# Patient Record
Sex: Female | Born: 1975 | Race: Black or African American | Hispanic: No | Marital: Single | Smoking: Never smoker
Health system: Southern US, Community
[De-identification: ages and names within clinical notes are randomized; demographics above are authoritative.]

## PROBLEM LIST (undated history)

## (undated) DIAGNOSIS — F419 Anxiety disorder, unspecified: Secondary | ICD-10-CM

---

## 2010-11-26 ENCOUNTER — Encounter: Payer: Self-pay | Admitting: *Deleted

## 2010-11-26 ENCOUNTER — Emergency Department (HOSPITAL_BASED_OUTPATIENT_CLINIC_OR_DEPARTMENT_OTHER)
Admission: EM | Admit: 2010-11-26 | Discharge: 2010-11-26 | Disposition: A | Discharge status: Home / Self Care | Payer: Medicaid Other | Attending: Emergency Medicine | Admitting: Emergency Medicine

## 2010-11-26 DIAGNOSIS — B373 Candidiasis of vulva and vagina: Secondary | ICD-10-CM | POA: Insufficient documentation

## 2010-11-26 DIAGNOSIS — B3731 Acute candidiasis of vulva and vagina: Secondary | ICD-10-CM | POA: Insufficient documentation

## 2010-11-26 DIAGNOSIS — N898 Other specified noninflammatory disorders of vagina: Secondary | ICD-10-CM | POA: Insufficient documentation

## 2010-11-26 HISTORY — DX: Anxiety disorder, unspecified: F41.9

## 2010-11-26 LAB — URINE MICROSCOPIC-ADD ON

## 2010-11-26 LAB — URINALYSIS, ROUTINE W REFLEX MICROSCOPIC
Glucose, UA: NEGATIVE mg/dL
pH: 6 (ref 5.0–8.0)

## 2010-11-26 LAB — PREGNANCY, URINE: Preg Test, Ur: NEGATIVE

## 2010-11-26 MED ORDER — FLUCONAZOLE 50 MG PO TABS
150.0000 mg | ORAL_TABLET | Freq: Once | ORAL | Status: AC
  Administered 2010-11-26: 150 mg via ORAL
  Filled 2010-11-26: qty 1

## 2010-11-26 NOTE — ED Notes (Signed)
Pt states she has vaginal itching since her last period- using miconazole without relief- unsure of vaginal d/c- also has a hemorrhoid she wants to have checked

## 2010-11-26 NOTE — ED Provider Notes (Signed)
History     CSN: [id number] Arrival date & time: 11/26/2010  5:13 PM  Chief Complaint  Patient presents with  . Vaginal Itching   Patient is a 35 y.o. female presenting with vaginal itching. The history is provided by the patient.  Vaginal Itching This is a new problem. The current episode started in the past 7 days. The problem occurs constantly. The problem has been unchanged. Pertinent negatives include no fever, nausea or urinary symptoms. The symptoms are aggravated by nothing. Treatments tried: otc yeast treatment. The treatment provided mild relief.  Pt complains of vaginal itching.  Pt reports she was diagnosed with a yeast infection and has been using otc medications.    Past Medical History  Diagnosis Date  . Anxiety     History reviewed. No pertinent past surgical history.  No family history on file.  History  Substance Use Topics  . Smoking status: Never Smoker   . Smokeless tobacco: Not on file  . Alcohol Use: No    OB History    Grav Para Term Preterm Abortions TAB SAB Ect Mult Living                  Review of Systems  Constitutional: Negative for fever.  Gastrointestinal: Negative for nausea.  Genitourinary: Positive for vaginal discharge.  All other systems reviewed and are negative.    Physical Exam  BP 120/86  Pulse 73  Temp(Src) 97.8 F (36.6 C) (Oral)  Resp 18  Ht 5\' 2"  (1.575 m)  Wt 175 lb (79.379 kg)  BMI 32.01 kg/m2  SpO2 100%  LMP 11/12/2010  Physical Exam  Nursing note and vitals reviewed. Constitutional: She appears well-developed and well-nourished.  HENT:  Head: Normocephalic and atraumatic.  Eyes: Pupils are equal, round, and reactive to light.  Neck: Normal range of motion.  Cardiovascular: Normal rate.   Pulmonary/Chest: Effort normal.  Abdominal: Soft. Bowel sounds are normal.  Genitourinary: Uterus normal. Vaginal discharge found.  Musculoskeletal: Normal range of motion.  Neurological: She is alert.  Skin: Skin is  warm.    ED Course  Procedures  MDM Pt given diflucan here.  Pt advised to follow with her MD for recheck.      Langston Masker, [place] 11/26/10 1906

## 2010-11-27 NOTE — ED Provider Notes (Signed)
Evaluation and management procedures were performed by the PA/NP under my supervision/collaboration.   Dione Booze, MD 11/27/10 [phone]

## 2014-12-07 ENCOUNTER — Encounter: Payer: Medicaid Other | Admitting: Obstetrics & Gynecology

## 2015-01-04 ENCOUNTER — Encounter: Payer: Medicaid Other | Admitting: Obstetrics and Gynecology

## 2015-01-05 ENCOUNTER — Encounter: Payer: Self-pay | Admitting: Obstetrics & Gynecology

## 2015-01-05 ENCOUNTER — Ambulatory Visit (INDEPENDENT_AMBULATORY_CARE_PROVIDER_SITE_OTHER): Payer: Medicaid Other | Admitting: Obstetrics & Gynecology

## 2015-01-05 NOTE — Progress Notes (Signed)
   CLINIC ENCOUNTER NOTE Patient was referred for contraception counseling but declined this during check-in process.  Visit cancelled as per her request.   Jaynie Collins, MD, FACOG Attending Obstetrician & Gynecologist, [facility] for [facility]

## 2018-10-19 ENCOUNTER — Encounter (HOSPITAL_BASED_OUTPATIENT_CLINIC_OR_DEPARTMENT_OTHER): Payer: Self-pay

## 2018-10-19 DIAGNOSIS — G479 Sleep disorder, unspecified: Secondary | ICD-10-CM

## 2018-11-03 ENCOUNTER — Encounter (HOSPITAL_BASED_OUTPATIENT_CLINIC_OR_DEPARTMENT_OTHER): Payer: Self-pay

## 2019-10-14 DIAGNOSIS — Z419 Encounter for procedure for purposes other than remedying health state, unspecified: Secondary | ICD-10-CM | POA: Diagnosis not present

## 2019-11-14 DIAGNOSIS — Z419 Encounter for procedure for purposes other than remedying health state, unspecified: Secondary | ICD-10-CM | POA: Diagnosis not present

## 2019-12-15 DIAGNOSIS — N182 Chronic kidney disease, stage 2 (mild): Secondary | ICD-10-CM | POA: Diagnosis not present

## 2019-12-15 DIAGNOSIS — D229 Melanocytic nevi, unspecified: Secondary | ICD-10-CM | POA: Diagnosis not present

## 2019-12-15 DIAGNOSIS — E6609 Other obesity due to excess calories: Secondary | ICD-10-CM | POA: Diagnosis not present

## 2019-12-15 DIAGNOSIS — E785 Hyperlipidemia, unspecified: Secondary | ICD-10-CM | POA: Diagnosis not present

## 2019-12-15 DIAGNOSIS — N912 Amenorrhea, unspecified: Secondary | ICD-10-CM | POA: Diagnosis not present

## 2019-12-15 DIAGNOSIS — I10 Essential (primary) hypertension: Secondary | ICD-10-CM | POA: Diagnosis not present

## 2019-12-15 DIAGNOSIS — F419 Anxiety disorder, unspecified: Secondary | ICD-10-CM | POA: Diagnosis not present

## 2019-12-15 DIAGNOSIS — Z124 Encounter for screening for malignant neoplasm of cervix: Secondary | ICD-10-CM | POA: Diagnosis not present

## 2019-12-15 DIAGNOSIS — Z309 Encounter for contraceptive management, unspecified: Secondary | ICD-10-CM | POA: Diagnosis not present

## 2019-12-15 DIAGNOSIS — K219 Gastro-esophageal reflux disease without esophagitis: Secondary | ICD-10-CM | POA: Diagnosis not present

## 2019-12-15 DIAGNOSIS — Z419 Encounter for procedure for purposes other than remedying health state, unspecified: Secondary | ICD-10-CM | POA: Diagnosis not present

## 2020-01-05 DIAGNOSIS — Z7689 Persons encountering health services in other specified circumstances: Secondary | ICD-10-CM | POA: Diagnosis not present

## 2020-01-05 DIAGNOSIS — N182 Chronic kidney disease, stage 2 (mild): Secondary | ICD-10-CM | POA: Diagnosis not present

## 2020-01-05 DIAGNOSIS — K219 Gastro-esophageal reflux disease without esophagitis: Secondary | ICD-10-CM | POA: Diagnosis not present

## 2020-01-05 DIAGNOSIS — Z309 Encounter for contraceptive management, unspecified: Secondary | ICD-10-CM | POA: Diagnosis not present

## 2020-01-05 DIAGNOSIS — F419 Anxiety disorder, unspecified: Secondary | ICD-10-CM | POA: Diagnosis not present

## 2020-01-05 DIAGNOSIS — I10 Essential (primary) hypertension: Secondary | ICD-10-CM | POA: Diagnosis not present

## 2020-01-05 DIAGNOSIS — N912 Amenorrhea, unspecified: Secondary | ICD-10-CM | POA: Diagnosis not present

## 2020-01-05 DIAGNOSIS — E6609 Other obesity due to excess calories: Secondary | ICD-10-CM | POA: Diagnosis not present

## 2020-01-05 DIAGNOSIS — Z8619 Personal history of other infectious and parasitic diseases: Secondary | ICD-10-CM | POA: Diagnosis not present

## 2020-01-05 DIAGNOSIS — E785 Hyperlipidemia, unspecified: Secondary | ICD-10-CM | POA: Diagnosis not present

## 2020-01-14 DIAGNOSIS — Z419 Encounter for procedure for purposes other than remedying health state, unspecified: Secondary | ICD-10-CM | POA: Diagnosis not present

## 2020-02-14 DIAGNOSIS — Z419 Encounter for procedure for purposes other than remedying health state, unspecified: Secondary | ICD-10-CM | POA: Diagnosis not present

## 2020-03-15 DIAGNOSIS — Z23 Encounter for immunization: Secondary | ICD-10-CM | POA: Diagnosis not present

## 2020-03-15 DIAGNOSIS — K219 Gastro-esophageal reflux disease without esophagitis: Secondary | ICD-10-CM | POA: Diagnosis not present

## 2020-03-15 DIAGNOSIS — Z419 Encounter for procedure for purposes other than remedying health state, unspecified: Secondary | ICD-10-CM | POA: Diagnosis not present

## 2020-03-15 DIAGNOSIS — E785 Hyperlipidemia, unspecified: Secondary | ICD-10-CM | POA: Diagnosis not present

## 2020-03-15 DIAGNOSIS — N182 Chronic kidney disease, stage 2 (mild): Secondary | ICD-10-CM | POA: Diagnosis not present

## 2020-03-15 DIAGNOSIS — R110 Nausea: Secondary | ICD-10-CM | POA: Diagnosis not present

## 2020-03-15 DIAGNOSIS — I10 Essential (primary) hypertension: Secondary | ICD-10-CM | POA: Diagnosis not present

## 2020-03-15 DIAGNOSIS — Z309 Encounter for contraceptive management, unspecified: Secondary | ICD-10-CM | POA: Diagnosis not present

## 2020-03-15 DIAGNOSIS — E6609 Other obesity due to excess calories: Secondary | ICD-10-CM | POA: Diagnosis not present

## 2020-03-15 DIAGNOSIS — Z7689 Persons encountering health services in other specified circumstances: Secondary | ICD-10-CM | POA: Diagnosis not present

## 2020-03-15 DIAGNOSIS — F419 Anxiety disorder, unspecified: Secondary | ICD-10-CM | POA: Diagnosis not present

## 2020-04-15 DIAGNOSIS — Z419 Encounter for procedure for purposes other than remedying health state, unspecified: Secondary | ICD-10-CM | POA: Diagnosis not present

## 2020-04-26 DIAGNOSIS — K219 Gastro-esophageal reflux disease without esophagitis: Secondary | ICD-10-CM | POA: Diagnosis not present

## 2020-04-26 DIAGNOSIS — R110 Nausea: Secondary | ICD-10-CM | POA: Diagnosis not present

## 2020-04-26 DIAGNOSIS — E6609 Other obesity due to excess calories: Secondary | ICD-10-CM | POA: Diagnosis not present

## 2020-04-26 DIAGNOSIS — F419 Anxiety disorder, unspecified: Secondary | ICD-10-CM | POA: Diagnosis not present

## 2020-04-26 DIAGNOSIS — E785 Hyperlipidemia, unspecified: Secondary | ICD-10-CM | POA: Diagnosis not present

## 2020-04-26 DIAGNOSIS — I10 Essential (primary) hypertension: Secondary | ICD-10-CM | POA: Diagnosis not present

## 2020-04-26 DIAGNOSIS — N182 Chronic kidney disease, stage 2 (mild): Secondary | ICD-10-CM | POA: Diagnosis not present

## 2020-04-26 DIAGNOSIS — Z7689 Persons encountering health services in other specified circumstances: Secondary | ICD-10-CM | POA: Diagnosis not present

## 2020-04-26 DIAGNOSIS — Z309 Encounter for contraceptive management, unspecified: Secondary | ICD-10-CM | POA: Diagnosis not present

## 2020-05-16 DIAGNOSIS — Z419 Encounter for procedure for purposes other than remedying health state, unspecified: Secondary | ICD-10-CM | POA: Diagnosis not present

## 2020-06-13 DIAGNOSIS — Z419 Encounter for procedure for purposes other than remedying health state, unspecified: Secondary | ICD-10-CM | POA: Diagnosis not present

## 2020-07-14 DIAGNOSIS — Z419 Encounter for procedure for purposes other than remedying health state, unspecified: Secondary | ICD-10-CM | POA: Diagnosis not present

## 2020-07-19 ENCOUNTER — Other Ambulatory Visit: Payer: Self-pay

## 2020-07-19 ENCOUNTER — Ambulatory Visit (HOSPITAL_COMMUNITY): Payer: Medicaid Other | Admitting: Licensed Clinical Social Worker

## 2020-07-19 DIAGNOSIS — Z008 Encounter for other general examination: Secondary | ICD-10-CM

## 2020-07-19 NOTE — Progress Notes (Signed)
Pt came into session today for initial assessment. She states that she left so inappropriate message on her social workers Engineer, technical sales. LCSW asked if she wanted therapy and medication mgmt. Pt stated not at this time. LCSW asked pt if she was her own guardian and pt stated yes. She was alert and oriented x 5. She states that her social worker wanted her to come in with an evaluation, but Phyllis King did not feel it was needed. LCSW explained right to self-determination and her patient rights. LCSW left assessment open in the future if pt felt it was needed.

## 2020-08-02 DIAGNOSIS — I10 Essential (primary) hypertension: Secondary | ICD-10-CM | POA: Diagnosis not present

## 2020-08-02 DIAGNOSIS — N182 Chronic kidney disease, stage 2 (mild): Secondary | ICD-10-CM | POA: Diagnosis not present

## 2020-08-02 DIAGNOSIS — F419 Anxiety disorder, unspecified: Secondary | ICD-10-CM | POA: Diagnosis not present

## 2020-08-02 DIAGNOSIS — K219 Gastro-esophageal reflux disease without esophagitis: Secondary | ICD-10-CM | POA: Diagnosis not present

## 2020-08-02 DIAGNOSIS — Z309 Encounter for contraceptive management, unspecified: Secondary | ICD-10-CM | POA: Diagnosis not present

## 2020-08-02 DIAGNOSIS — E785 Hyperlipidemia, unspecified: Secondary | ICD-10-CM | POA: Diagnosis not present

## 2020-08-02 DIAGNOSIS — E6609 Other obesity due to excess calories: Secondary | ICD-10-CM | POA: Diagnosis not present

## 2020-08-02 DIAGNOSIS — E039 Hypothyroidism, unspecified: Secondary | ICD-10-CM | POA: Diagnosis not present

## 2020-08-13 DIAGNOSIS — Z419 Encounter for procedure for purposes other than remedying health state, unspecified: Secondary | ICD-10-CM | POA: Diagnosis not present

## 2020-09-12 DIAGNOSIS — E785 Hyperlipidemia, unspecified: Secondary | ICD-10-CM | POA: Diagnosis not present

## 2020-09-12 DIAGNOSIS — Z7689 Persons encountering health services in other specified circumstances: Secondary | ICD-10-CM | POA: Diagnosis not present

## 2020-09-12 DIAGNOSIS — E039 Hypothyroidism, unspecified: Secondary | ICD-10-CM | POA: Diagnosis not present

## 2020-09-12 DIAGNOSIS — N182 Chronic kidney disease, stage 2 (mild): Secondary | ICD-10-CM | POA: Diagnosis not present

## 2020-09-12 DIAGNOSIS — I10 Essential (primary) hypertension: Secondary | ICD-10-CM | POA: Diagnosis not present

## 2020-09-12 DIAGNOSIS — Z309 Encounter for contraceptive management, unspecified: Secondary | ICD-10-CM | POA: Diagnosis not present

## 2020-09-12 DIAGNOSIS — K219 Gastro-esophageal reflux disease without esophagitis: Secondary | ICD-10-CM | POA: Diagnosis not present

## 2020-09-12 DIAGNOSIS — E6609 Other obesity due to excess calories: Secondary | ICD-10-CM | POA: Diagnosis not present

## 2020-09-12 DIAGNOSIS — F419 Anxiety disorder, unspecified: Secondary | ICD-10-CM | POA: Diagnosis not present

## 2020-09-12 DIAGNOSIS — R110 Nausea: Secondary | ICD-10-CM | POA: Diagnosis not present

## 2020-09-13 DIAGNOSIS — Z419 Encounter for procedure for purposes other than remedying health state, unspecified: Secondary | ICD-10-CM | POA: Diagnosis not present

## 2020-10-13 DIAGNOSIS — Z419 Encounter for procedure for purposes other than remedying health state, unspecified: Secondary | ICD-10-CM | POA: Diagnosis not present

## 2020-11-13 DIAGNOSIS — Z419 Encounter for procedure for purposes other than remedying health state, unspecified: Secondary | ICD-10-CM | POA: Diagnosis not present

## 2020-11-22 DIAGNOSIS — N182 Chronic kidney disease, stage 2 (mild): Secondary | ICD-10-CM | POA: Diagnosis not present

## 2020-11-22 DIAGNOSIS — I10 Essential (primary) hypertension: Secondary | ICD-10-CM | POA: Diagnosis not present

## 2020-11-22 DIAGNOSIS — Z0000 Encounter for general adult medical examination without abnormal findings: Secondary | ICD-10-CM | POA: Diagnosis not present

## 2020-11-22 DIAGNOSIS — F419 Anxiety disorder, unspecified: Secondary | ICD-10-CM | POA: Diagnosis not present

## 2020-11-22 DIAGNOSIS — Z309 Encounter for contraceptive management, unspecified: Secondary | ICD-10-CM | POA: Diagnosis not present

## 2020-11-22 DIAGNOSIS — E785 Hyperlipidemia, unspecified: Secondary | ICD-10-CM | POA: Diagnosis not present

## 2020-11-22 DIAGNOSIS — E6609 Other obesity due to excess calories: Secondary | ICD-10-CM | POA: Diagnosis not present

## 2020-11-22 DIAGNOSIS — E039 Hypothyroidism, unspecified: Secondary | ICD-10-CM | POA: Diagnosis not present

## 2020-11-22 DIAGNOSIS — K219 Gastro-esophageal reflux disease without esophagitis: Secondary | ICD-10-CM | POA: Diagnosis not present

## 2020-11-30 ENCOUNTER — Other Ambulatory Visit: Payer: Self-pay | Admitting: Physician Assistant

## 2020-11-30 DIAGNOSIS — Z1231 Encounter for screening mammogram for malignant neoplasm of breast: Secondary | ICD-10-CM

## 2020-12-04 ENCOUNTER — Other Ambulatory Visit: Payer: Self-pay

## 2020-12-04 ENCOUNTER — Ambulatory Visit
Admission: RE | Admit: 2020-12-04 | Discharge: 2020-12-04 | Disposition: A | Discharge status: Home / Self Care | Payer: Medicaid Other | Source: Ambulatory Visit | Attending: Physician Assistant | Admitting: Physician Assistant

## 2020-12-04 DIAGNOSIS — Z1231 Encounter for screening mammogram for malignant neoplasm of breast: Secondary | ICD-10-CM | POA: Diagnosis not present

## 2020-12-14 DIAGNOSIS — Z419 Encounter for procedure for purposes other than remedying health state, unspecified: Secondary | ICD-10-CM | POA: Diagnosis not present

## 2021-01-13 DIAGNOSIS — Z419 Encounter for procedure for purposes other than remedying health state, unspecified: Secondary | ICD-10-CM | POA: Diagnosis not present

## 2021-02-13 DIAGNOSIS — Z419 Encounter for procedure for purposes other than remedying health state, unspecified: Secondary | ICD-10-CM | POA: Diagnosis not present

## 2021-02-22 DIAGNOSIS — F419 Anxiety disorder, unspecified: Secondary | ICD-10-CM | POA: Diagnosis not present

## 2021-02-22 DIAGNOSIS — E039 Hypothyroidism, unspecified: Secondary | ICD-10-CM | POA: Diagnosis not present

## 2021-02-22 DIAGNOSIS — E6609 Other obesity due to excess calories: Secondary | ICD-10-CM | POA: Diagnosis not present

## 2021-02-22 DIAGNOSIS — I10 Essential (primary) hypertension: Secondary | ICD-10-CM | POA: Diagnosis not present

## 2021-02-22 DIAGNOSIS — K219 Gastro-esophageal reflux disease without esophagitis: Secondary | ICD-10-CM | POA: Diagnosis not present

## 2021-02-22 DIAGNOSIS — Z309 Encounter for contraceptive management, unspecified: Secondary | ICD-10-CM | POA: Diagnosis not present

## 2021-02-22 DIAGNOSIS — E785 Hyperlipidemia, unspecified: Secondary | ICD-10-CM | POA: Diagnosis not present

## 2021-02-22 DIAGNOSIS — N182 Chronic kidney disease, stage 2 (mild): Secondary | ICD-10-CM | POA: Diagnosis not present

## 2021-03-15 DIAGNOSIS — Z419 Encounter for procedure for purposes other than remedying health state, unspecified: Secondary | ICD-10-CM | POA: Diagnosis not present

## 2021-04-15 DIAGNOSIS — Z419 Encounter for procedure for purposes other than remedying health state, unspecified: Secondary | ICD-10-CM | POA: Diagnosis not present

## 2021-05-16 DIAGNOSIS — Z419 Encounter for procedure for purposes other than remedying health state, unspecified: Secondary | ICD-10-CM | POA: Diagnosis not present

## 2021-06-13 DIAGNOSIS — Z419 Encounter for procedure for purposes other than remedying health state, unspecified: Secondary | ICD-10-CM | POA: Diagnosis not present

## 2021-07-03 DIAGNOSIS — E039 Hypothyroidism, unspecified: Secondary | ICD-10-CM | POA: Diagnosis not present

## 2021-07-03 DIAGNOSIS — E785 Hyperlipidemia, unspecified: Secondary | ICD-10-CM | POA: Diagnosis not present

## 2021-07-03 DIAGNOSIS — K219 Gastro-esophageal reflux disease without esophagitis: Secondary | ICD-10-CM | POA: Diagnosis not present

## 2021-07-03 DIAGNOSIS — E6609 Other obesity due to excess calories: Secondary | ICD-10-CM | POA: Diagnosis not present

## 2021-07-03 DIAGNOSIS — F419 Anxiety disorder, unspecified: Secondary | ICD-10-CM | POA: Diagnosis not present

## 2021-07-03 DIAGNOSIS — Z131 Encounter for screening for diabetes mellitus: Secondary | ICD-10-CM | POA: Diagnosis not present

## 2021-07-03 DIAGNOSIS — N182 Chronic kidney disease, stage 2 (mild): Secondary | ICD-10-CM | POA: Diagnosis not present

## 2021-07-03 DIAGNOSIS — Z309 Encounter for contraceptive management, unspecified: Secondary | ICD-10-CM | POA: Diagnosis not present

## 2021-07-03 DIAGNOSIS — I10 Essential (primary) hypertension: Secondary | ICD-10-CM | POA: Diagnosis not present

## 2021-07-14 DIAGNOSIS — Z419 Encounter for procedure for purposes other than remedying health state, unspecified: Secondary | ICD-10-CM | POA: Diagnosis not present

## 2021-08-13 DIAGNOSIS — Z419 Encounter for procedure for purposes other than remedying health state, unspecified: Secondary | ICD-10-CM | POA: Diagnosis not present

## 2021-09-13 DIAGNOSIS — Z419 Encounter for procedure for purposes other than remedying health state, unspecified: Secondary | ICD-10-CM | POA: Diagnosis not present

## 2021-10-13 DIAGNOSIS — Z419 Encounter for procedure for purposes other than remedying health state, unspecified: Secondary | ICD-10-CM | POA: Diagnosis not present

## 2021-11-06 ENCOUNTER — Other Ambulatory Visit: Payer: Self-pay | Admitting: Physician Assistant

## 2021-11-06 DIAGNOSIS — Z0000 Encounter for general adult medical examination without abnormal findings: Secondary | ICD-10-CM | POA: Diagnosis not present

## 2021-11-06 DIAGNOSIS — F419 Anxiety disorder, unspecified: Secondary | ICD-10-CM | POA: Diagnosis not present

## 2021-11-06 DIAGNOSIS — K219 Gastro-esophageal reflux disease without esophagitis: Secondary | ICD-10-CM | POA: Diagnosis not present

## 2021-11-06 DIAGNOSIS — N182 Chronic kidney disease, stage 2 (mild): Secondary | ICD-10-CM | POA: Diagnosis not present

## 2021-11-06 DIAGNOSIS — I10 Essential (primary) hypertension: Secondary | ICD-10-CM | POA: Diagnosis not present

## 2021-11-06 DIAGNOSIS — E6609 Other obesity due to excess calories: Secondary | ICD-10-CM | POA: Diagnosis not present

## 2021-11-06 DIAGNOSIS — Z131 Encounter for screening for diabetes mellitus: Secondary | ICD-10-CM | POA: Diagnosis not present

## 2021-11-06 DIAGNOSIS — Z1231 Encounter for screening mammogram for malignant neoplasm of breast: Secondary | ICD-10-CM

## 2021-11-06 DIAGNOSIS — Z309 Encounter for contraceptive management, unspecified: Secondary | ICD-10-CM | POA: Diagnosis not present

## 2021-11-06 DIAGNOSIS — E039 Hypothyroidism, unspecified: Secondary | ICD-10-CM | POA: Diagnosis not present

## 2021-11-06 DIAGNOSIS — E785 Hyperlipidemia, unspecified: Secondary | ICD-10-CM | POA: Diagnosis not present

## 2021-11-13 DIAGNOSIS — Z419 Encounter for procedure for purposes other than remedying health state, unspecified: Secondary | ICD-10-CM | POA: Diagnosis not present

## 2021-12-11 ENCOUNTER — Ambulatory Visit
Admission: RE | Admit: 2021-12-11 | Discharge: 2021-12-11 | Disposition: A | Discharge status: Home / Self Care | Payer: Medicaid Other | Source: Ambulatory Visit | Attending: Physician Assistant | Admitting: Physician Assistant

## 2021-12-11 ENCOUNTER — Ambulatory Visit: Payer: Medicaid Other

## 2021-12-11 DIAGNOSIS — Z1231 Encounter for screening mammogram for malignant neoplasm of breast: Secondary | ICD-10-CM

## 2021-12-14 DIAGNOSIS — Z419 Encounter for procedure for purposes other than remedying health state, unspecified: Secondary | ICD-10-CM | POA: Diagnosis not present

## 2022-01-13 DIAGNOSIS — Z419 Encounter for procedure for purposes other than remedying health state, unspecified: Secondary | ICD-10-CM | POA: Diagnosis not present

## 2022-02-04 IMAGING — MG MM DIGITAL SCREENING BILAT W/ TOMO AND CAD
8 series · 8 of 24 positions shown · non-contrast
Comparison: None.

CLINICAL DATA: Screening.

EXAM:
DIGITAL SCREENING BILATERAL MAMMOGRAM WITH TOMOSYNTHESIS AND CAD
TECHNIQUE: Bilateral screening digital craniocaudal and mediolateral oblique
mammograms were obtained. Bilateral screening digital breast
tomosynthesis was performed. The images were evaluated with
computer-aided detection.

[L CC synth-2D]
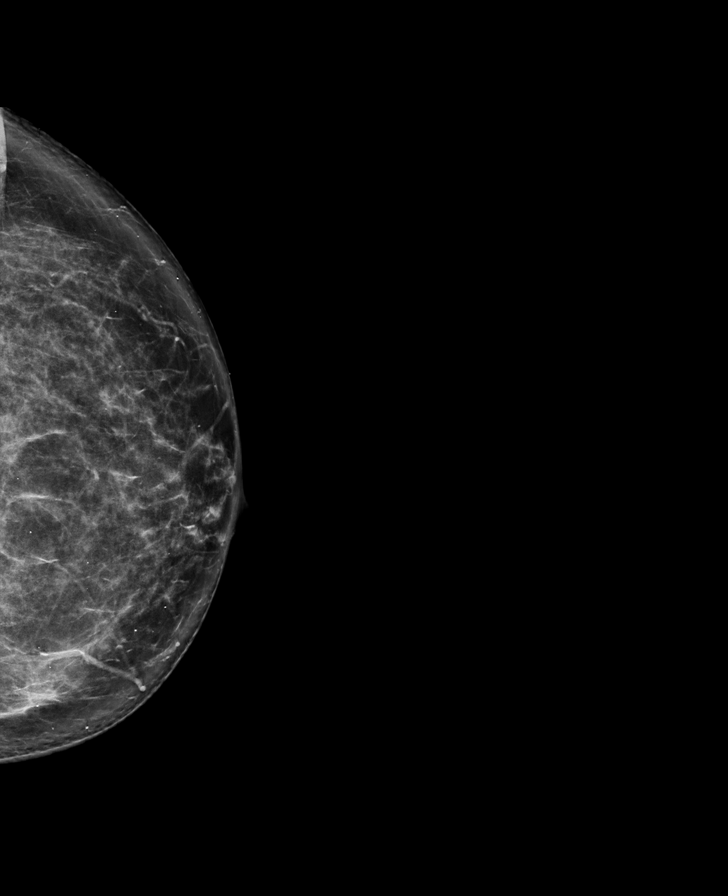

[R CC synth-2D]
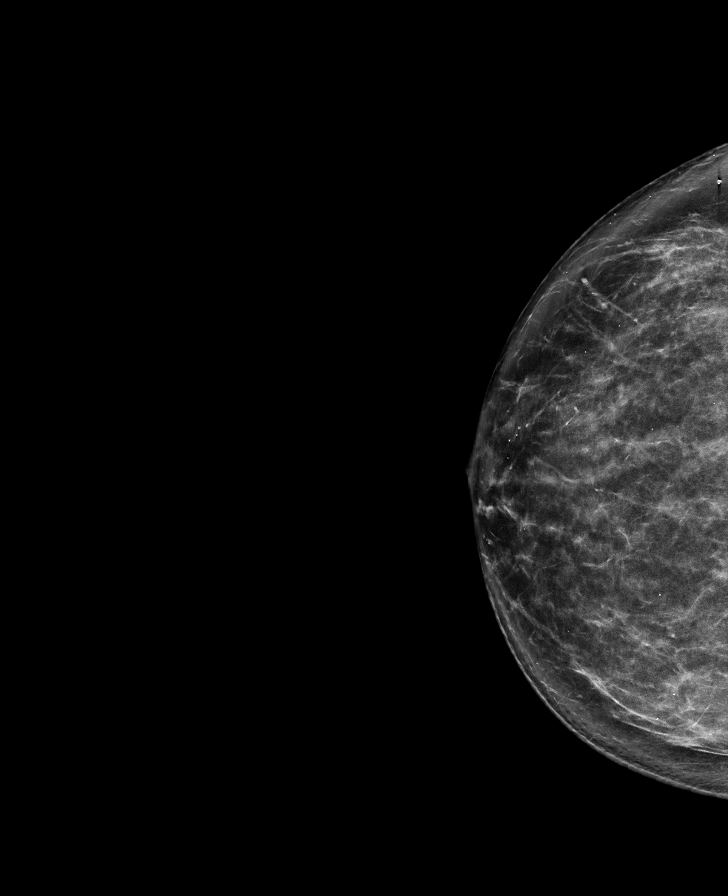

[L MLO synth-2D]
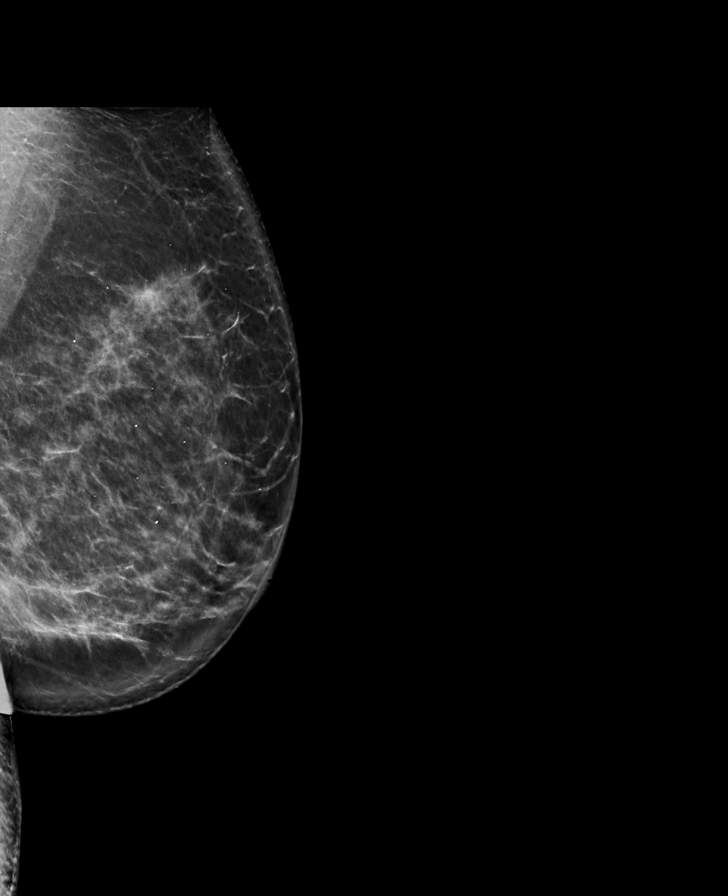

[R MLO synth-2D]
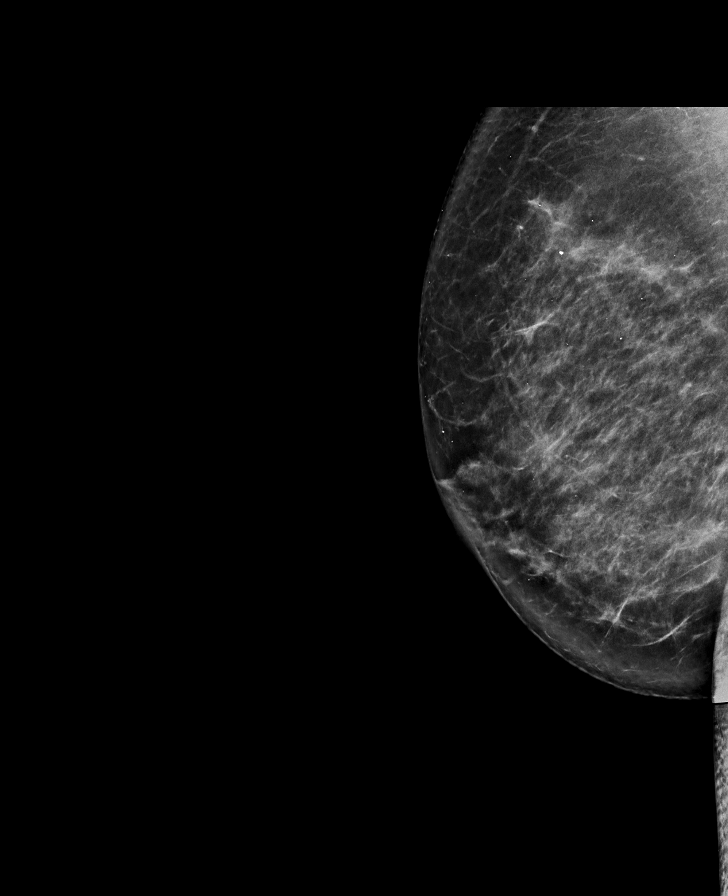

[R CC tomo · tomo slice 43/86.0]
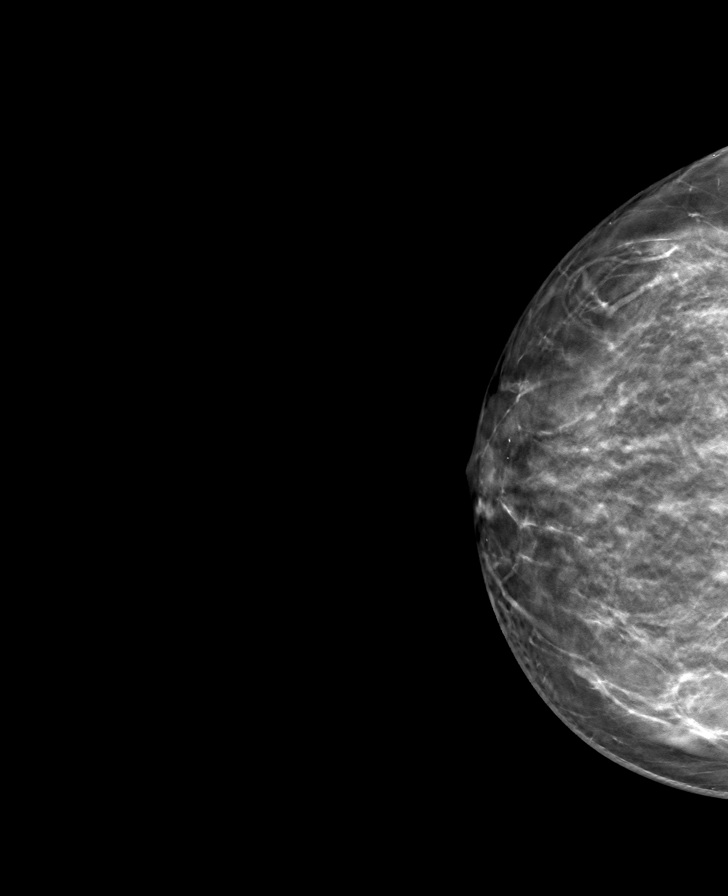

[L MLO tomo · tomo slice 48/95.0]
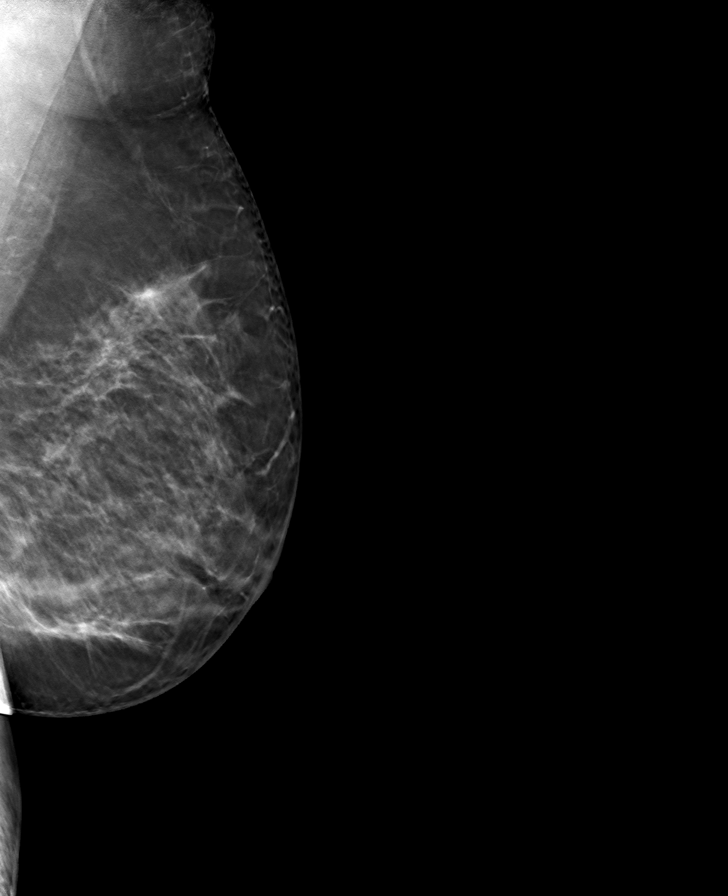

[R MLO tomo · tomo slice 47/93.0]
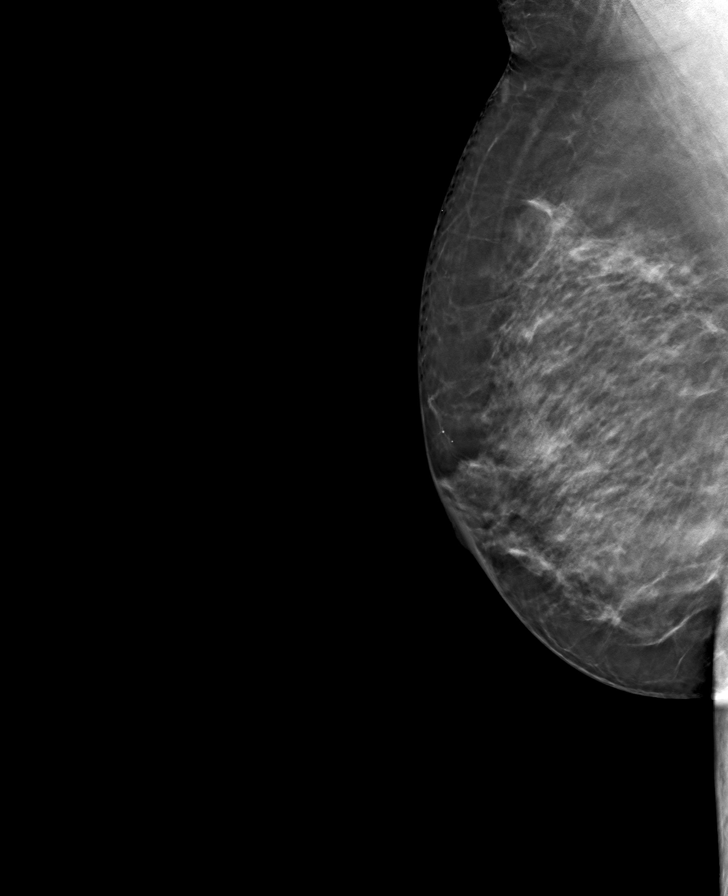

[L CC tomo · tomo slice 43/84.0]
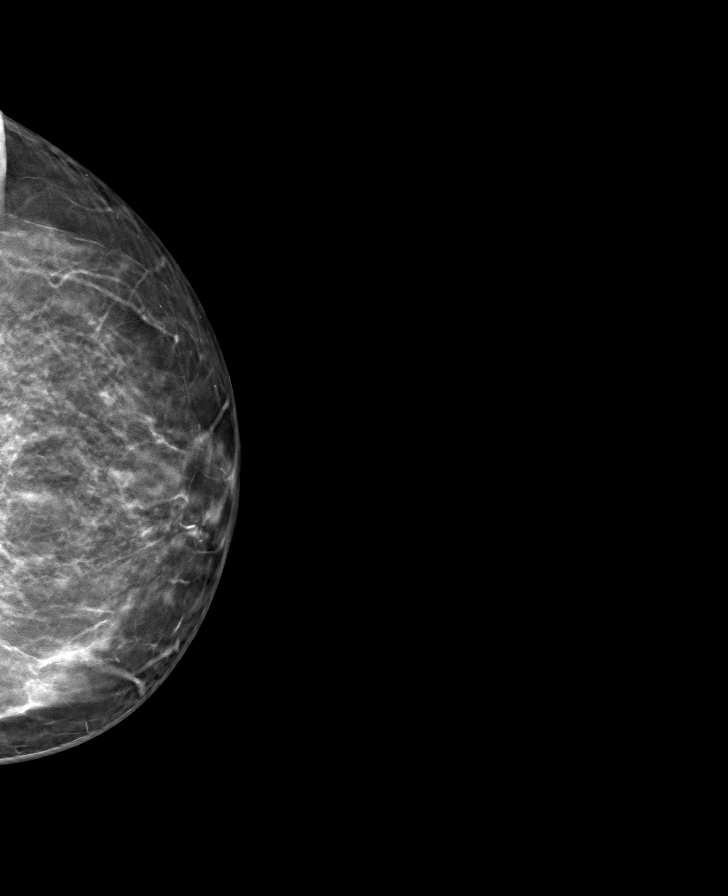

[8 of 24 positions shown; findings below may reference images not displayed]

ACR Breast Density Category c: The breast tissue is heterogeneously
dense, which may obscure small masses
FINDINGS: There are no findings suspicious for malignancy.
IMPRESSION: No mammographic evidence of malignancy. A result letter of this
screening mammogram will be mailed directly to the patient.

RECOMMENDATION:
Screening mammogram in one year. (Code:C8-T-HNK)

BI-RADS CATEGORY  1: Negative.

## 2022-02-07 DIAGNOSIS — F419 Anxiety disorder, unspecified: Secondary | ICD-10-CM | POA: Diagnosis not present

## 2022-02-07 DIAGNOSIS — I10 Essential (primary) hypertension: Secondary | ICD-10-CM | POA: Diagnosis not present

## 2022-02-07 DIAGNOSIS — K219 Gastro-esophageal reflux disease without esophagitis: Secondary | ICD-10-CM | POA: Diagnosis not present

## 2022-02-07 DIAGNOSIS — N182 Chronic kidney disease, stage 2 (mild): Secondary | ICD-10-CM | POA: Diagnosis not present

## 2022-02-07 DIAGNOSIS — Z309 Encounter for contraceptive management, unspecified: Secondary | ICD-10-CM | POA: Diagnosis not present

## 2022-02-07 DIAGNOSIS — E6609 Other obesity due to excess calories: Secondary | ICD-10-CM | POA: Diagnosis not present

## 2022-02-07 DIAGNOSIS — E785 Hyperlipidemia, unspecified: Secondary | ICD-10-CM | POA: Diagnosis not present

## 2022-02-07 DIAGNOSIS — E039 Hypothyroidism, unspecified: Secondary | ICD-10-CM | POA: Diagnosis not present

## 2022-02-07 DIAGNOSIS — Z23 Encounter for immunization: Secondary | ICD-10-CM | POA: Diagnosis not present

## 2022-02-13 DIAGNOSIS — Z419 Encounter for procedure for purposes other than remedying health state, unspecified: Secondary | ICD-10-CM | POA: Diagnosis not present

## 2022-03-15 DIAGNOSIS — Z419 Encounter for procedure for purposes other than remedying health state, unspecified: Secondary | ICD-10-CM | POA: Diagnosis not present

## 2022-11-06 ENCOUNTER — Other Ambulatory Visit: Payer: Self-pay | Admitting: Physician Assistant

## 2022-11-06 DIAGNOSIS — Z1231 Encounter for screening mammogram for malignant neoplasm of breast: Secondary | ICD-10-CM

## 2022-12-13 ENCOUNTER — Ambulatory Visit
Admission: RE | Admit: 2022-12-13 | Discharge: 2022-12-13 | Disposition: A | Discharge status: Home / Self Care | Payer: Medicaid Other | Source: Ambulatory Visit | Attending: Physician Assistant | Admitting: Physician Assistant

## 2022-12-13 DIAGNOSIS — Z1231 Encounter for screening mammogram for malignant neoplasm of breast: Secondary | ICD-10-CM

## 2023-11-05 ENCOUNTER — Encounter: Payer: Self-pay | Admitting: Physician Assistant

## 2023-11-06 ENCOUNTER — Other Ambulatory Visit: Payer: Self-pay | Admitting: Physician Assistant

## 2023-11-06 DIAGNOSIS — Z1231 Encounter for screening mammogram for malignant neoplasm of breast: Secondary | ICD-10-CM

## 2023-12-16 ENCOUNTER — Ambulatory Visit
Admission: RE | Admit: 2023-12-16 | Discharge: 2023-12-16 | Disposition: A | Discharge status: Home / Self Care | Source: Ambulatory Visit | Attending: Physician Assistant | Admitting: Physician Assistant

## 2023-12-16 DIAGNOSIS — Z1231 Encounter for screening mammogram for malignant neoplasm of breast: Secondary | ICD-10-CM
